# Patient Record
Sex: Male | Born: 1985 | Race: Asian | Hispanic: No | Marital: Married | Smoking: Never smoker
Health system: Southern US, Community
[De-identification: ages and names within clinical notes are randomized; demographics above are authoritative.]

## PROBLEM LIST (undated history)

## (undated) DIAGNOSIS — E039 Hypothyroidism, unspecified: Secondary | ICD-10-CM

## (undated) DIAGNOSIS — E785 Hyperlipidemia, unspecified: Secondary | ICD-10-CM

## (undated) DIAGNOSIS — I493 Ventricular premature depolarization: Secondary | ICD-10-CM

## (undated) HISTORY — DX: Hyperlipidemia, unspecified: E78.5

---

## 2017-02-19 ENCOUNTER — Encounter (HOSPITAL_COMMUNITY): Payer: Self-pay | Admitting: Emergency Medicine

## 2017-02-19 ENCOUNTER — Emergency Department (HOSPITAL_COMMUNITY): Payer: 59

## 2017-02-19 ENCOUNTER — Emergency Department (HOSPITAL_COMMUNITY)
Admission: EM | Admit: 2017-02-19 | Discharge: 2017-02-19 | Disposition: A | Discharge status: Home / Self Care | Payer: 59 | Attending: Emergency Medicine | Admitting: Emergency Medicine

## 2017-02-19 DIAGNOSIS — R1030 Lower abdominal pain, unspecified: Secondary | ICD-10-CM | POA: Diagnosis present

## 2017-02-19 DIAGNOSIS — E039 Hypothyroidism, unspecified: Secondary | ICD-10-CM | POA: Diagnosis not present

## 2017-02-19 DIAGNOSIS — K529 Noninfective gastroenteritis and colitis, unspecified: Secondary | ICD-10-CM | POA: Diagnosis not present

## 2017-02-19 HISTORY — DX: Hypothyroidism, unspecified: E03.9

## 2017-02-19 LAB — URINALYSIS, ROUTINE W REFLEX MICROSCOPIC
BILIRUBIN URINE: NEGATIVE
Bacteria, UA: NONE SEEN
GLUCOSE, UA: NEGATIVE mg/dL
KETONES UR: 20 mg/dL — AB
LEUKOCYTES UA: NEGATIVE
NITRITE: NEGATIVE
PH: 6 (ref 5.0–8.0)
Protein, ur: 30 mg/dL — AB
SPECIFIC GRAVITY, URINE: 1.015 (ref 1.005–1.030)
Squamous Epithelial / LPF: NONE SEEN

## 2017-02-19 LAB — CBC
HCT: 47.5 % (ref 39.0–52.0)
Hemoglobin: 16.3 g/dL (ref 13.0–17.0)
MCH: 29.5 pg (ref 26.0–34.0)
MCHC: 34.3 g/dL (ref 30.0–36.0)
MCV: 86.1 fL (ref 78.0–100.0)
PLATELETS: 232 10*3/uL (ref 150–400)
RBC: 5.52 MIL/uL (ref 4.22–5.81)
RDW: 12.9 % (ref 11.5–15.5)
WBC: 8.4 10*3/uL (ref 4.0–10.5)

## 2017-02-19 LAB — LIPASE, BLOOD: LIPASE: 30 U/L (ref 11–51)

## 2017-02-19 LAB — COMPREHENSIVE METABOLIC PANEL
ALT: 14 U/L — AB (ref 17–63)
AST: 23 U/L (ref 15–41)
Albumin: 4 g/dL (ref 3.5–5.0)
Alkaline Phosphatase: 50 U/L (ref 38–126)
Anion gap: 11 (ref 5–15)
BILIRUBIN TOTAL: 0.8 mg/dL (ref 0.3–1.2)
BUN: 9 mg/dL (ref 6–20)
CALCIUM: 8.9 mg/dL (ref 8.9–10.3)
CHLORIDE: 100 mmol/L — AB (ref 101–111)
CO2: 25 mmol/L (ref 22–32)
CREATININE: 1.21 mg/dL (ref 0.61–1.24)
Glucose, Bld: 96 mg/dL (ref 65–99)
Potassium: 4 mmol/L (ref 3.5–5.1)
Sodium: 136 mmol/L (ref 135–145)
TOTAL PROTEIN: 7.5 g/dL (ref 6.5–8.1)

## 2017-02-19 MED ORDER — IOPAMIDOL (ISOVUE-300) INJECTION 61%
INTRAVENOUS | Status: AC
  Filled 2017-02-19: qty 100

## 2017-02-19 MED ORDER — SODIUM CHLORIDE 0.9 % IV BOLUS (SEPSIS)
1000.0000 mL | Freq: Once | INTRAVENOUS | Status: AC
  Administered 2017-02-19: 1000 mL via INTRAVENOUS

## 2017-02-19 MED ORDER — METRONIDAZOLE 500 MG PO TABS: 500.0000 mg | ORAL_TABLET | Freq: Three times a day (TID) | ORAL | 0 refills | Status: DC

## 2017-02-19 MED ORDER — CIPROFLOXACIN HCL 500 MG PO TABS: 500.0000 mg | ORAL_TABLET | Freq: Two times a day (BID) | ORAL | 0 refills | Status: DC

## 2017-02-19 MED ORDER — IOPAMIDOL (ISOVUE-300) INJECTION 61%
100.0000 mL | Freq: Once | INTRAVENOUS | Status: AC | PRN
  Administered 2017-02-19: 100 mL via INTRAVENOUS

## 2017-02-19 MED ORDER — ALBUTEROL SULFATE HFA 108 (90 BASE) MCG/ACT IN AERS
2.0000 | INHALATION_SPRAY | RESPIRATORY_TRACT | Status: DC | PRN
  Administered 2017-02-19: 2 via RESPIRATORY_TRACT
  Filled 2017-02-19: qty 6.7

## 2017-02-19 NOTE — ED Notes (Signed)
ED Provider at bedside. 

## 2017-02-19 NOTE — ED Provider Notes (Signed)
[facility]-EMERGENCY DEPT Provider Note   CSN: [id number] Arrival date & time: 02/19/17  4098     History   Chief Complaint Chief Complaint  Patient presents with  . Abdominal Pain    HPI George Lopez is a 31 y.o. male.  Patient is a 31 year old male with no significant past medical history who presents with abdominal pain and diarrhea. He states it started about 30 hours ago with watery diarrhea and pain to his lower abdomen. The pain has continued. He still having some watery, nonbloody diarrhea. He denies any nausea or vomiting. He denies any urinary symptoms. He has had some fevers reported at home. He lives in [place]. He recently had an endoscopy for some reflux symptoms and was diagnosed with H. pylori. He finished antibiotics for this 3 days ago. He denies any other gastrointestinal issues.  He recently traveled back from [place] to the [place] yesterday. He has not taken anything for the symptoms.      Past Medical History:  Diagnosis Date  . Hypothyroid     There are no active problems to display for this patient.   History reviewed. No pertinent surgical history.     Home Medications    Prior to Admission medications   Medication Sig Start Date End Date Taking? Authorizing Provider  ciprofloxacin (CIPRO) 500 MG tablet Take 1 tablet (500 mg total) by mouth 2 (two) times daily. One po bid x 10 days 02/19/17   Rolan Bucco, MD  metroNIDAZOLE (FLAGYL) 500 MG tablet Take 1 tablet (500 mg total) by mouth 3 (three) times daily. One po bid x 10 days 02/19/17   Rolan Bucco, MD    Family History History reviewed. No pertinent family history.  Social History Social History  Substance Use Topics  . Smoking status: Never Smoker  . Smokeless tobacco: Never Used  . Alcohol use No     Allergies   Patient has no known allergies.   Review of Systems Review of Systems  Constitutional: Positive for fever. Negative for chills, diaphoresis and fatigue.  HENT:  Negative for congestion, rhinorrhea and sneezing.   Eyes: Negative.   Respiratory: Negative for cough, chest tightness and shortness of breath.   Cardiovascular: Negative for chest pain and leg swelling.  Gastrointestinal: Positive for abdominal pain and diarrhea. Negative for blood in stool, nausea and vomiting.  Genitourinary: Negative for difficulty urinating, flank pain, frequency and hematuria.  Musculoskeletal: Negative for arthralgias and back pain.  Skin: Negative for rash.  Neurological: Negative for dizziness, speech difficulty, weakness, numbness and headaches.     Physical Exam Updated Vital Signs BP 105/73 (BP Location: Right Arm)   Pulse 91   Temp 99.9 F (37.7 C) (Oral)   Resp 18   Ht  (1.575 m)   Wt 61 kg (134 lb 7.7 oz)   SpO2 100%   BMI 24.60 kg/m   Physical Exam  Constitutional: He is oriented to person, place, and time. He appears well-developed and well-nourished.  HENT:  Head: Normocephalic and atraumatic.  Eyes: Pupils are equal, round, and reactive to light.  Neck: Normal range of motion. Neck supple.  Cardiovascular: Normal rate, regular rhythm and normal heart sounds.   Pulmonary/Chest: Effort normal and breath sounds normal. No respiratory distress. He has no wheezes. He has no rales. He exhibits no tenderness.  Abdominal: Soft. Bowel sounds are normal. There is tenderness (Positive tenderness to the P umbilical area and right lower quadrant). There is no rebound and no guarding.  Musculoskeletal: Normal range of motion. He exhibits no edema.  Lymphadenopathy:    He has no cervical adenopathy.  Neurological: He is alert and oriented to person, place, and time.  Skin: Skin is warm and dry. No rash noted.  Psychiatric: He has a normal mood and affect.     ED Treatments / Results  Labs (all labs ordered are listed, but only abnormal results are displayed) Labs Reviewed  COMPREHENSIVE METABOLIC PANEL - Abnormal; Notable for the following:        Result Value   Chloride 100 (*)    ALT 14 (*)    All other components within normal limits  URINALYSIS, ROUTINE W REFLEX MICROSCOPIC - Abnormal; Notable for the following:    Hgb urine dipstick MODERATE (*)    Ketones, ur 20 (*)    Protein, ur 30 (*)    All other components within normal limits  LIPASE, BLOOD  CBC    EKG  EKG Interpretation None       Radiology Ct Abdomen Pelvis W Contrast  Result Date: 02/19/2017 CLINICAL DATA:  Pt reports lower abd pain and diarrhea for the past day and a half. Just flew in from [place]. No emesis. Has had a fever.Had endoscopy a month ago and was diagnosed with H pylori. Finished abx for this 3 days ago. EXAM: CT ABDOMEN AND PELVIS WITH CONTRAST TECHNIQUE: Multidetector CT imaging of the abdomen and pelvis was performed using the standard protocol following bolus administration of intravenous contrast. CONTRAST:  ISOVUE-300 IOPAMIDOL (ISOVUE-300) INJECTION 61% COMPARISON:  None. FINDINGS: Lower chest: Clear lung bases heart.  Normal size. Hepatobiliary: No focal liver abnormality is seen. No gallstones, gallbladder wall thickening, or biliary dilatation. Pancreas: Unremarkable. No pancreatic ductal dilatation or surrounding inflammatory changes. Spleen: Normal in size without focal abnormality. Adrenals/Urinary Tract: Adrenal glands are unremarkable. Kidneys are normal, without renal calculi, focal lesion, or hydronephrosis. Bladder is unremarkable. Stomach/Bowel: There is wall thickening of the right colon extending to the mid transverse colon. Subtle adjacent mesenteric inflammation. Remainder of the colon is unremarkable. Stomach and small bowel are unremarkable. Appendix contains air, but is mildly dilated measuring 7 mm. This is likely a normal variant for this patient. There is no peri appendiceal inflammation. Vascular/Lymphatic: No vascular abnormality. There are prominent ileo colic mesenteric lymph nodes, largest measuring 7 mm in short  axis. Reproductive: Unremarkable. Other: No abdominal wall hernia or abnormality. No abdominopelvic ascites. Musculoskeletal: No acute or significant osseous findings. IMPRESSION: 1. Colitis involving the right colon. This may be infectious or inflammatory in etiology. 2. Appendix is mildly dilated, but there is no associated inflammation. Air is seen extending into this appendiceal tip. This is likely a normal variant for this patient. 3. No other evidence of an acute abnormality. Electronically Signed   By: Amie Portland M.D.   On: 02/19/2017 09:59    Procedures Procedures (including critical care time)  Medications Ordered in ED Medications  iopamidol (ISOVUE-300) 61 % injection (not administered)  albuterol (PROVENTIL HFA;VENTOLIN HFA) 108 (90 Base) MCG/ACT inhaler 2 puff (not administered)  sodium chloride 0.9 % bolus 1,000 mL (0 mLs Intravenous Stopped 02/19/17 1014)  iopamidol (ISOVUE-300) 61 % injection 100 mL (100 mLs Intravenous Contrast Given 02/19/17 0937)     Initial Impression / Assessment and Plan / ED Course  I have reviewed the triage vital signs and the nursing notes.  Pertinent labs & imaging results that were available during my care of the patient were reviewed by me and  considered in my medical decision making (see chart for details).     Patient presents with watery diarrhea and lower abdominal pain. He did have some tenderness to the right lower quadrant so a CT scan was ordered to assess if the appendix. There is some slight enlargement any appendix without suggestions of appendicitis. There is evidence of colitis. I will go ahead and treat him with Cipro and Flagyl. I advised him to use a clear liquid diet for the next 24 hours and slowly progress after this. I did has some slight enlargement of the appendix and he needs to be reassessed if he has any worsening pain in his abdomen, in particular to the right lower quadrant. He does acknowledge this.  I also advised him  that he did have some blood in his urine. Advised at some point he needs to have his urine rechecked to make sure that the blood clears. On discharge he had some slight wheezing on lung exam. He denies any shortness of breath. He states that he typically gets wheezing when he is exposed to dust and he has used an inhaler before but he does not currently have an inhaler. He denies any shortness of breath. No pleuritic chest pain or other suggestions of a pulmonary embolus. He was dispensed an albuterol inhaler. Return precautions were given.  Final Clinical Impressions(s) / ED Diagnoses   Final diagnoses:  Colitis    New Prescriptions New Prescriptions   CIPROFLOXACIN (CIPRO) 500 MG TABLET    Take 1 tablet (500 mg total) by mouth 2 (two) times daily. One po bid x 10 days   METRONIDAZOLE (FLAGYL) 500 MG TABLET    Take 1 tablet (500 mg total) by mouth 3 (three) times daily. One po bid x 10 days     Rolan Bucco, MD 02/19/17 1040

## 2017-02-19 NOTE — ED Notes (Signed)
Pt began complaining of shortness of breath starting 10 minutes ago. Inspiratory and expiratory wheezes heard while listening to lung sounds. O2 sat and vitals are stable. Will continue to monitor.

## 2017-02-19 NOTE — ED Triage Notes (Addendum)
Pt reports lower abd pain and diarrhea for the past day and a half. Just flew in from [place]. No emesis. Has had a fever. Had endoscopy a month ago and was diagnosed with H pylori. Finished abx for this 3 days ago.

## 2017-06-26 ENCOUNTER — Encounter (HOSPITAL_COMMUNITY): Payer: Self-pay | Admitting: *Deleted

## 2017-06-26 ENCOUNTER — Other Ambulatory Visit: Payer: Self-pay

## 2017-06-26 DIAGNOSIS — E039 Hypothyroidism, unspecified: Secondary | ICD-10-CM | POA: Diagnosis not present

## 2017-06-26 DIAGNOSIS — R002 Palpitations: Secondary | ICD-10-CM | POA: Diagnosis not present

## 2017-06-26 DIAGNOSIS — I493 Ventricular premature depolarization: Secondary | ICD-10-CM | POA: Diagnosis not present

## 2017-06-26 LAB — CBC
HCT: 48 % (ref 39.0–52.0)
HEMOGLOBIN: 16.3 g/dL (ref 13.0–17.0)
MCH: 29.3 pg (ref 26.0–34.0)
MCHC: 34 g/dL (ref 30.0–36.0)
MCV: 86.2 fL (ref 78.0–100.0)
Platelets: 293 10*3/uL (ref 150–400)
RBC: 5.57 MIL/uL (ref 4.22–5.81)
RDW: 13 % (ref 11.5–15.5)
WBC: 8.5 10*3/uL (ref 4.0–10.5)

## 2017-06-26 LAB — BASIC METABOLIC PANEL
ANION GAP: 11 (ref 5–15)
BUN: 9 mg/dL (ref 6–20)
CALCIUM: 8.9 mg/dL (ref 8.9–10.3)
CO2: 24 mmol/L (ref 22–32)
Chloride: 105 mmol/L (ref 101–111)
Creatinine, Ser: 0.92 mg/dL (ref 0.61–1.24)
GFR calc non Af Amer: >60 mL/min (ref >60)
Glucose, Bld: 88 mg/dL (ref 65–99)
Potassium: 3.6 mmol/L (ref 3.5–5.1)
Sodium: 140 mmol/L (ref 135–145)

## 2017-06-26 LAB — I-STAT TROPONIN, ED: TROPONIN I, POC: 0 ng/mL (ref 0.00–0.08)

## 2017-06-26 NOTE — ED Triage Notes (Signed)
Pt has a hx of palpitations, felt his heart racing today and was seen at [facility], then referred to ED. Reports frequent belching and increased stress today. Initial rhythm at [facility] showed frequent PVCs. Pt recently moved to [place] from [place] three months ago. Reports he had been consistently following up with a cardiologist in [place] for the past 10 years, has had negative stress tests and has worn a halter in the past without a definitive diagnosis. Pt also has hx of hypothyroidism, was taken Levothyroxine 100mcg, which was increased 125mcg, but has not had TSH rechecked. Last check was in September.

## 2017-06-27 ENCOUNTER — Other Ambulatory Visit: Payer: Self-pay

## 2017-06-27 ENCOUNTER — Emergency Department (HOSPITAL_COMMUNITY)
Admission: EM | Admit: 2017-06-27 | Discharge: 2017-06-27 | Disposition: A | Discharge status: Home / Self Care | Payer: 59 | Attending: Emergency Medicine | Admitting: Emergency Medicine

## 2017-06-27 ENCOUNTER — Emergency Department (HOSPITAL_COMMUNITY): Payer: 59

## 2017-06-27 DIAGNOSIS — I493 Ventricular premature depolarization: Secondary | ICD-10-CM

## 2017-06-27 DIAGNOSIS — R002 Palpitations: Secondary | ICD-10-CM

## 2017-06-27 HISTORY — DX: Ventricular premature depolarization: I49.3

## 2017-06-27 LAB — TSH: TSH: 1.709 u[IU]/mL (ref 0.350–4.500)

## 2017-06-27 LAB — I-STAT TROPONIN, ED: Troponin i, poc: 0 ng/mL (ref 0.00–0.08)

## 2017-06-27 LAB — MAGNESIUM: MAGNESIUM: 2.2 mg/dL (ref 1.7–2.4)

## 2017-06-27 NOTE — ED Notes (Signed)
Recollect blood for istat troponin.

## 2017-06-27 NOTE — Discharge Instructions (Signed)
There is no evidence of heart attack.  You're likely feeling PVCs.  Follow-up with your doctor.  Return to the ED if you develop chest pain, shortness of breath or any other concerns.

## 2017-06-27 NOTE — ED Provider Notes (Signed)
[name] EM[facility]ENCY [facility] Provider Note   CSN: [id number] Arrival date & time: 06/26/17  2218     History   Chief Complaint Chief Complaint  Patient presents with  . Palpitations    HPI George Lopez is a 32 y.o. male.  Patient presents from walk-in clinic with sensation of palpitations.  States this started this afternoon after undergoing a stressful meeting this morning.  He denies any chest pain or shortness of breath.  No dizziness or lightheadedness.  [place] from [place] about 3 months ago.  He states he has a history of arrhythmia and followed up with a cardiologist every year in [place].  He has had normal echocardiograms, stress test and Holter monitors.  He was not prescribed any medications for his arrhythmia.  He also has a history of hypothyroidism and is on levothyroxine at a stable dose.  Denies any recent constipation, fatigue, tiredness.  He does admit to drinking more caffeine since [place] the [place].  Good p.o. intake and urine output.  His palpitations have improved since he came to the ED.  He denies any illicit drug or alcohol use.   The history is provided by the patient.  Palpitations   Pertinent negatives include no fever, no abdominal pain, no nausea, no vomiting, no dizziness, no weakness and no shortness of breath.    Past Medical History:  Diagnosis Date  . Hypothyroid   . PVC (premature ventricular contraction)     There are no active problems to display for this patient.   History reviewed. No pertinent surgical history.     Home Medications    Prior to Admission medications   Medication Sig Start Date End Date Taking? Authorizing Provider  ciprofloxacin (CIPRO) 500 MG tablet Take 1 tablet (500 mg total) by mouth 2 (two) times daily. One po bid x 10 days 02/19/17   Rolan BuccoBelfi, Melanie, MD  metroNIDAZOLE (FLAGYL) 500 MG tablet Take 1 tablet (500 mg total) by mouth 3 (three) times daily. One po bid x 10 days  02/19/17   Rolan BuccoBelfi, Melanie, MD    Family History No family history on file.  Social History Social History   Tobacco Use  . Smoking status: Never Smoker  . Smokeless tobacco: Never Used  Substance Use Topics  . Alcohol use: No  . Drug use: Not on file     Allergies   Patient has no known allergies.   Review of Systems Review of Systems  Constitutional: Negative for activity change, appetite change and fever.  HENT: Negative for congestion.   Eyes: Negative for visual disturbance.  Respiratory: Negative for chest tightness and shortness of breath.   Cardiovascular: Positive for palpitations.  Gastrointestinal: Negative for abdominal pain, nausea and vomiting.  Genitourinary: Negative for dysuria and hematuria.  Musculoskeletal: Negative for arthralgias and myalgias.  Skin: Negative for rash.  Neurological: Negative for dizziness and weakness.    all other systems are negative except as noted in the HPI and PMH.    Physical Exam Updated Vital Signs BP (!) 132/93   Pulse 89   Temp 98.8 F (37.1 C) (Oral)   Resp 16   SpO2 100%   Physical Exam  Constitutional: He is oriented to person, place, and time. He appears well-developed and well-nourished. No distress.  HENT:  Head: Normocephalic and atraumatic.  Mouth/Throat: Oropharynx is clear and moist. No oropharyngeal exudate.  Eyes: Conjunctivae and EOM are normal. Pupils are equal, round, and reactive to  light.  Neck: Normal range of motion. Neck supple.  No meningismus.  Cardiovascular: Normal rate, regular rhythm, normal heart sounds and intact distal pulses.  No murmur heard. Frequent PVCs on monitor Irregular rhythm  Pulmonary/Chest: Effort normal and breath sounds normal. No respiratory distress.  Abdominal: Soft. There is no tenderness. There is no rebound and no guarding.  Musculoskeletal: Normal range of motion. He exhibits no edema or tenderness.  Neurological: He is alert and oriented to person, place,  and time. No cranial nerve deficit. He exhibits normal muscle tone. Coordination normal.  No ataxia on finger to nose bilaterally. No pronator drift. 5/5 strength throughout. CN 2-12 intact.Equal grip strength. Sensation intact.   Skin: Skin is warm.  Psychiatric: He has a normal mood and affect. His behavior is normal.  Nursing note and vitals reviewed.    ED Treatments / Results  Labs (all labs ordered are listed, but only abnormal results are displayed) Labs Reviewed  BASIC METABOLIC PANEL  CBC  TSH  MAGNESIUM  I-STAT TROPONIN, ED  I-STAT TROPONIN, ED    EKG  EKG Interpretation  Date/Time:  Monday June 26 2017 22:24:36 EST Ventricular Rate:  84 PR Interval:  148 QRS Duration: 106 QT Interval:  400 QTC Calculation: 472 R Axis:   77 Text Interpretation:  Sinus rhythm with frequent Premature ventricular complexes Otherwise normal ECG No old tracing to compare Confirmed by Dione Booze ([id number]) on 06/26/2017 11:55:01 PM       Radiology Dg Chest 2 View  Result Date: 06/27/2017 CLINICAL DATA:  Acute onset of palpitations.  Tachycardia. EXAM: CHEST  2 VIEW COMPARISON:  None. FINDINGS: The lungs are well-aerated and clear. There is no evidence of focal opacification, pleural effusion or pneumothorax. The heart is normal in size; the mediastinal contour is within normal limits. No acute osseous abnormalities are seen. IMPRESSION: No acute cardiopulmonary process seen. Electronically Signed   By: Roanna Raider M.D.   On: 06/27/2017 03:02    Procedures Procedures (including critical care time)  Medications Ordered in ED Medications - No data to display   Initial Impression / Assessment and Plan / ED Course  I have reviewed the triage vital signs and the nursing notes.  Pertinent labs & imaging results that were available during my care of the patient were reviewed by me and considered in my medical decision making (see chart for details).    Patient with history of  "arrhythmia" sent from walk-in clinic with palpitations.  No chest pain or shortness of breath.  EKG shows frequent PVCs.  Labs are reassuring.  Troponin is negative.  Electrolytes are normal.  Patient with no chest pain or shortness of breath.  Troponin negative x2.  Suspect his palpitations are due to his PVCs.  He admits to recent poor sleep and increased caffeine use.  Patient states he was followed in [place] by cardiologist for the past 10 years but had reassuring testing including Holter monitor and echocardiogram.  He was never placed on any cardiac medications.  He is requesting referral to cardiology and endocrinology for follow-up.  Referrals given.  PCP follow-up.  Return precautions discussed.    Final Clinical Impressions(s) / ED Diagnoses   Final diagnoses:  Palpitations  PVC (premature ventricular contraction)    ED Discharge Orders    None       Deneice Wack, Jeannett Senior, MD 06/27/17 0403

## 2017-07-04 ENCOUNTER — Encounter: Payer: Self-pay | Admitting: Cardiology

## 2017-07-04 ENCOUNTER — Ambulatory Visit (INDEPENDENT_AMBULATORY_CARE_PROVIDER_SITE_OTHER): Payer: PRIVATE HEALTH INSURANCE | Admitting: Cardiology

## 2017-07-04 VITALS — BP 128/80 | HR 97 | Ht 62.0 in | Wt 138.0 lb

## 2017-07-04 DIAGNOSIS — I493 Ventricular premature depolarization: Secondary | ICD-10-CM | POA: Insufficient documentation

## 2017-07-04 DIAGNOSIS — Z13220 Encounter for screening for lipoid disorders: Secondary | ICD-10-CM

## 2017-07-04 DIAGNOSIS — R9431 Abnormal electrocardiogram [ECG] [EKG]: Secondary | ICD-10-CM | POA: Insufficient documentation

## 2017-07-04 DIAGNOSIS — R079 Chest pain, unspecified: Secondary | ICD-10-CM

## 2017-07-04 NOTE — Progress Notes (Signed)
Cardiology Office Note:    Date:  07/04/2017   ID:  George Lopez, DOB Jun 10, 1985, MRN [id number]  PCP:  Patient, No Pcp Per  Cardiologist:  Garwin Brothers, MD   Referring MD: No ref. provider found    ASSESSMENT:    1. Chest pain, unspecified type   2. Abnormal electrocardiogram (ECG) (EKG)   3. Premature ventricular contractions (PVCs) (VPCs)   4. Screening for cholesterol level    PLAN:    In order of problems listed above:  1. Primary prevention stressed with the patient.  Importance of compliance with diet and medications stressed and he vocalized understanding. 2. Is stable.  Importance of regular exercise stressed.  I told him that in view of his chest discomfort I would like to do a stress echo.  If this is normal then he should start an exercise program.  We will check his lipids today. 3. His thyroid and other issues are managed by his primary care physician.  He will be seen in follow-up appointment in a month or earlier if he has any concerns.   Medication Adjustments/Labs and Tests Ordered: Current medicines are reviewed at length with the patient today.  Concerns regarding medicines are outlined above.  Orders Placed This Encounter  Procedures  . Lipid panel  . Hepatic function panel  . ECHOCARDIOGRAM STRESS TEST   No orders of the defined types were placed in this encounter.    History of Present Illness:    George Lopez is a 32 y.o. male who is being seen today for the evaluation of PVCs and skipped beat sensation and palpitations.  Patient is a pleasant 32 year old male.  He has past medical history abnormal EKGs in the past.  He is evaluated for this by his physicians in [place].  He has been around here for the past couple of months and he tells me that he has been under significant stress at work and has been drinking a lot of coffee.  He feels a little chest discomfort at times.  This is not related to exertion.  It is sharp in nature with no  radiation.  No syncope.  At the time of my evaluation, the patient is alert awake oriented and in no distress.  Past Medical History:  Diagnosis Date  . Hypothyroid   . PVC (premature ventricular contraction)     History reviewed. No pertinent surgical history.  Current Medications: Current Meds  Medication Sig  . levothyroxine (SYNTHROID, LEVOTHROID) 125 MCG tablet Take 125 mcg by mouth daily.  [name] omeprazole (PRILOSEC) 20 MG capsule Take 20 mg by mouth daily as needed (for acid reflux).      Allergies:   Patient has no known allergies.   Social History   Socioeconomic History  . Marital status: Married    Spouse name: None  . Number of children: None  . Years of education: None  . Highest education level: None  Social Needs  . Financial resource strain: None  . Food insecurity - worry: None  . Food insecurity - inability: None  . Transportation needs - medical: None  . Transportation needs - non-medical: None  Occupational History  . None  Tobacco Use  . Smoking status: Never Smoker  . Smokeless tobacco: Never Used  Substance and Sexual Activity  . Alcohol use: No  . Drug use: None  . Sexual activity: None  Other Topics Concern  . None  Social History Narrative  . None     Family  History: The patient's family history is not on file.  ROS:   Please see the history of present illness.    All other systems reviewed and are negative.  EKGs/Labs/Other Studies Reviewed:    The following studies were reviewed today: I discussed my findings with the patient at length.  EKG done and sent to me revealed sinus rhythm and PVCs and the patient's event that she has had similar EKGs in the past.   Recent Labs: 02/19/2017: ALT 14 06/26/2017: BUN 9; Creatinine, Ser 0.92; Hemoglobin 16.3; Platelets 293; Potassium 3.6; Sodium 140; TSH 1.709 06/27/2017: Magnesium 2.2  Recent Lipid Panel No results found for: CHOL, TRIG, HDL, CHOLHDL, VLDL, LDLCALC, LDLDIRECT  Physical  Exam:    VS:  BP 128/80 (BP Location: Left Arm, Patient Position: Sitting, Cuff Size: Normal)   Pulse 97   Ht 5\' 2"  (1.575 m)   Wt 138 lb (62.6 kg)   SpO2 98%   BMI 25.24 kg/m     Wt Readings from Last 3 Encounters:  07/04/17 138 lb (62.6 kg)  02/19/17 134 lb 7.7 oz (61 kg)     GEN: Patient is in no acute distress HEENT: Normal NECK: No JVD; No carotid bruits LYMPHATICS: No lymphadenopathy CARDIAC: S1 S2 regular, 2/6 systolic murmur at the apex. RESPIRATORY:  Clear to auscultation without rales, wheezing or rhonchi  ABDOMEN: Soft, non-tender, non-distended MUSCULOSKELETAL:  No edema; No deformity  SKIN: Warm and dry NEUROLOGIC:  Alert and oriented x 3 PSYCHIATRIC:  Normal affect    Signed, Garwin Brothersajan R Houston Surges, MD  07/04/2017 9:35 AM    [facility]

## 2017-07-04 NOTE — Patient Instructions (Signed)
Medication Instructions:  Your physician recommends that you continue on your current medications as directed. Please refer to the Current Medication list given to you today.  Labwork: Your physician recommends that you have the following labs drawn: Liver and lipid panel  Testing/Procedures: Your physician has requested that you have a stress echocardiogram. For further information please visit [email or web address]. Please follow instruction sheet as given.  Follow-Up: Your physician recommends that you schedule a follow-up appointment in: 1 month  Any Other Special Instructions Will Be Listed Below (If Applicable).     If you need a refill on your cardiac medications before your next appointment, please call your pharmacy.   [facility]  Garey HamAshley A, RN, BSN

## 2017-07-05 ENCOUNTER — Telehealth: Payer: Self-pay | Admitting: Cardiology

## 2017-07-05 LAB — LIPID PANEL
CHOL/HDL RATIO: 4.5 ratio (ref 0.0–5.0)
Cholesterol, Total: 204 mg/dL — ABNORMAL HIGH (ref 100–199)
HDL: 45 mg/dL (ref >39)
LDL CALC: 123 mg/dL — AB (ref 0–99)
TRIGLYCERIDES: 178 mg/dL — AB (ref 0–149)
VLDL CHOLESTEROL CAL: 36 mg/dL (ref 5–40)

## 2017-07-05 LAB — HEPATIC FUNCTION PANEL
ALT: 16 IU/L (ref 0–44)
AST: 18 IU/L (ref 0–40)
Albumin: 4.6 g/dL (ref 3.5–5.5)
Alkaline Phosphatase: 60 IU/L (ref 39–117)
BILIRUBIN, DIRECT: 0.08 mg/dL (ref 0.00–0.40)
Bilirubin Total: 0.3 mg/dL (ref 0.0–1.2)
Total Protein: 7.5 g/dL (ref 6.0–8.5)

## 2017-07-05 NOTE — Telephone Encounter (Signed)
Spoke with pt regarding his lab work

## 2017-07-05 NOTE — Telephone Encounter (Signed)
Someone called him, not sure why.

## 2017-07-28 ENCOUNTER — Other Ambulatory Visit (HOSPITAL_BASED_OUTPATIENT_CLINIC_OR_DEPARTMENT_OTHER): Payer: PRIVATE HEALTH INSURANCE

## 2017-07-31 ENCOUNTER — Ambulatory Visit (HOSPITAL_BASED_OUTPATIENT_CLINIC_OR_DEPARTMENT_OTHER)
Admission: RE | Admit: 2017-07-31 | Discharge: 2017-07-31 | Disposition: A | Discharge status: Home / Self Care | Payer: 59 | Source: Ambulatory Visit | Attending: Cardiology | Admitting: Cardiology

## 2017-07-31 DIAGNOSIS — R9431 Abnormal electrocardiogram [ECG] [EKG]: Secondary | ICD-10-CM | POA: Insufficient documentation

## 2017-07-31 DIAGNOSIS — I493 Ventricular premature depolarization: Secondary | ICD-10-CM | POA: Diagnosis not present

## 2017-07-31 DIAGNOSIS — R079 Chest pain, unspecified: Secondary | ICD-10-CM | POA: Diagnosis not present

## 2017-07-31 NOTE — Progress Notes (Signed)
Echocardiogram Echocardiogram Stress Test has been performed.  Dorothey BasemanReel, Bresha Hosack M 07/31/2017, 3:00 PM

## 2017-08-01 ENCOUNTER — Ambulatory Visit (INDEPENDENT_AMBULATORY_CARE_PROVIDER_SITE_OTHER): Payer: PRIVATE HEALTH INSURANCE | Admitting: Cardiology

## 2017-08-01 ENCOUNTER — Encounter: Payer: Self-pay | Admitting: Cardiology

## 2017-08-01 VITALS — BP 110/68 | HR 77 | Ht 61.0 in | Wt 135.1 lb

## 2017-08-01 DIAGNOSIS — R9431 Abnormal electrocardiogram [ECG] [EKG]: Secondary | ICD-10-CM | POA: Diagnosis not present

## 2017-08-01 DIAGNOSIS — I493 Ventricular premature depolarization: Secondary | ICD-10-CM

## 2017-08-01 NOTE — Patient Instructions (Signed)
Medication Instructions:  Your physician recommends that you continue on your current medications as directed. Please refer to the Current Medication list given to you today.  Labwork: None  Testing/Procedures: None  Follow-Up: Your physician recommends that you schedule a follow-up appointment in: as neeeded  Any Other Special Instructions Will Be Listed Below (If Applicable).     If you need a refill on your cardiac medications before your next appointment, please call your pharmacy.   [facility]  Garey HamAshley A, RN, BSN

## 2017-08-01 NOTE — Progress Notes (Signed)
Cardiology Office Note:    Date:  08/01/2017   ID:  George Lopez, DOB [birth year], MRN [id number]  PCP:  Patient, No Pcp Per  Cardiologist:  Garwin Brothersajan R Jenan Ellegood, MD   Referring MD: No ref. provider found    ASSESSMENT:    1. Premature ventricular contractions (PVCs) (VPCs)   2. Abnormal electrocardiogram (ECG) (EKG)    PLAN:    In order of problems listed above:  1. Primary prevention stressed with the patient.  Importance of compliance with diet and medications stressed and he vocalized understanding.  His blood pressure stable.  His exercise tolerance is excellent and his PVCs defect disappeared with exercise which is an excellent prognostic sign.  He is very happy to hear about this. 2. Lipids were reviewed extensively and diet was discussed.  He is going to be established with a primary care physician in the next few days. 3. He will be seen in follow-up appointment on a as needed basis only.   Medication Adjustments/Labs and Tests Ordered: Current medicines are reviewed at length with the patient today.  Concerns regarding medicines are outlined above.  No orders of the defined types were placed in this encounter.  No orders of the defined types were placed in this encounter.    Chief Complaint  Patient presents with  . Follow-up    Post cardiac testing     History of Present Illness:    George Lopez is a 32 y.o. male.  The patient was evaluated for chest pain like symptoms and PVCs.  His evaluation was unremarkable.  He did very well on the stress echo and his family is now here for area and he tells me that he is much more happier and is eating healthier.  No chest pain orthopnea or PND.  He has begun an exercise program.  At the time of my evaluation, the patient is alert awake oriented and in no distress.  Past Medical History:  Diagnosis Date  . Hypothyroid   . PVC (premature ventricular contraction)     History reviewed. No pertinent surgical  history.  Current Medications: No outpatient medications have been marked as taking for the 08/01/17 encounter (Office Visit) with Dennys Traughber, Aundra Dubinajan R, MD.     Allergies:   Patient has no known allergies.   Social History   Socioeconomic History  . Marital status: Married    Spouse name: None  . Number of children: None  . Years of education: None  . Highest education level: None  Social Needs  . Financial resource strain: None  . Food insecurity - worry: None  . Food insecurity - inability: None  . Transportation needs - medical: None  . Transportation needs - non-medical: None  Occupational History  . None  Tobacco Use  . Smoking status: Never Smoker  . Smokeless tobacco: Never Used  Substance and Sexual Activity  . Alcohol use: No  . Drug use: None  . Sexual activity: None  Other Topics Concern  . None  Social History Narrative  . None     Family History: The patient's family history is not on file.  ROS:   Please see the history of present illness.    All other systems reviewed and are negative.  EKGs/Labs/Other Studies Reviewed:    The following studies were reviewed today: I discussed the findings of the stress echo, lipids with the patient at extensive length.   Recent Labs: 06/26/2017: BUN 9; Creatinine, Ser 0.92; Hemoglobin 16.3; Platelets 293; Potassium  3.6; Sodium 140; TSH 1.709 06/27/2017: Magnesium 2.2 07/04/2017: ALT 16  Recent Lipid Panel    Component Value Date/Time   CHOL 204 (H) 07/04/2017 0937   TRIG 178 (H) 07/04/2017 0937   HDL 45 07/04/2017 0937   CHOLHDL 4.5 07/04/2017 0937   LDLCALC 123 (H) 07/04/2017 0937    Physical Exam:    VS:  BP 110/68 (BP Location: Left Arm, Patient Position: Sitting)   Pulse 77   Ht 5\' 1"  (1.549 m)   Wt 135 lb 1.3 oz (61.3 kg)   SpO2 98%   BMI 25.52 kg/m     Wt Readings from Last 3 Encounters:  08/01/17 135 lb 1.3 oz (61.3 kg)  07/04/17 138 lb (62.6 kg)  02/19/17 134 lb 7.7 oz (61 kg)     GEN:  Patient is in no acute distress HEENT: Normal NECK: No JVD; No carotid bruits LYMPHATICS: No lymphadenopathy CARDIAC: Hear sounds regular, 2/6 systolic murmur at the apex. RESPIRATORY:  Clear to auscultation without rales, wheezing or rhonchi  ABDOMEN: Soft, non-tender, non-distended MUSCULOSKELETAL:  No edema; No deformity  SKIN: Warm and dry NEUROLOGIC:  Alert and oriented x 3 PSYCHIATRIC:  Normal affect   Signed, Garwin Brothers, MD  08/01/2017 9:49 AM     [facility]

## 2018-02-20 ENCOUNTER — Encounter: Payer: Self-pay | Admitting: Endocrinology

## 2018-02-20 ENCOUNTER — Ambulatory Visit (INDEPENDENT_AMBULATORY_CARE_PROVIDER_SITE_OTHER): Payer: 59 | Admitting: Endocrinology

## 2018-02-20 VITALS — BP 126/80 | HR 86 | Ht 61.5 in | Wt 136.0 lb

## 2018-02-20 DIAGNOSIS — E063 Autoimmune thyroiditis: Secondary | ICD-10-CM | POA: Diagnosis not present

## 2018-02-20 DIAGNOSIS — L80 Vitiligo: Secondary | ICD-10-CM

## 2018-02-20 DIAGNOSIS — Z23 Encounter for immunization: Secondary | ICD-10-CM | POA: Diagnosis not present

## 2018-02-20 DIAGNOSIS — Z131 Encounter for screening for diabetes mellitus: Secondary | ICD-10-CM | POA: Diagnosis not present

## 2018-02-20 LAB — T4, FREE: FREE T4: 1.3 ng/dL (ref 0.60–1.60)

## 2018-02-20 LAB — T3, FREE: T3 FREE: 3.5 pg/mL (ref 2.3–4.2)

## 2018-02-20 LAB — TSH: TSH: 1.09 u[IU]/mL (ref 0.35–4.50)

## 2018-02-20 LAB — HEMOGLOBIN A1C: HEMOGLOBIN A1C: 5.4 % (ref 4.6–6.5)

## 2018-02-20 NOTE — Progress Notes (Signed)
Patient ID: George Lopez, male   DOB: 1985/09/28, 32 y.o.   MRN: [id number]            Referring Provider: Dr. Caryn Bee Via  Reason for Appointment:  Hypothyroidism, new visit    History of Present Illness:   Hypothyroidism was first diagnosed in 2013  At the time of diagnosis patient was having a routine medical exam through his work and TSH were tested He was not complaining of any symptoms of  fatigue, cold sensitivity, difficulty concentrating, dry skin, weight gain at that time.           The patient has been treated with progressively higher doses of levothyroxine    With starting thyroid supplementation he did not feel any different Initially for the first couple of years he was taking his levothyroxine irregularly and was only taking 25 mcg start with He thinks that his TSH in the first couple of years was probably around 5-6 but no records are available  He says that in 2017 his TSH was staying relatively higher and his dose had been progressively increased up to 88 mcg However right after that his TSH was low for a short time In April 2018 apparently his dose was increased up to 125 mcg because his TSH reportedly went up to 26 At that time he was having some symptoms of fatigability but no weight change, cold intolerance or other symptoms Subsequently has been feeling better  Recently has had no fatigue, weight change or somnolence He has had TSH levels twice this year including the last one in June which have been normal  The patient takes the thyroid supplement before breakfast         Patient's weight history is as follows:  Wt Readings from Last 3 Encounters:  02/20/18 136 lb (61.7 kg)  08/01/17 135 lb 1.3 oz (61.3 kg)  07/04/17 138 lb (62.6 kg)    Thyroid function results have been as follows:  TSH on 11/03/2017 was 1.4  Lab Results  Component Value Date   TSH 1.709 06/26/2017     Past Medical History:  Diagnosis Date  . Hypothyroid   . PVC  (premature ventricular contraction)     History reviewed. No pertinent surgical history.  Family History  Problem Relation Age of Onset  . Hyperthyroidism Mother   . Hypertension Mother   . Diabetes Father     Social History:  reports that he has never smoked. He has never used smokeless tobacco. He reports that he drinks alcohol. His drug history is not on file.  Allergies: No Known Allergies  Allergies as of 02/20/2018   No Known Allergies     Medication List        Accurate as of 02/20/18 11:10 AM. Always use your most recent med list.          levothyroxine 125 MCG tablet Commonly known as:  SYNTHROID, LEVOTHROID Take 125 mcg by mouth daily.          Review of Systems  Constitutional: Negative for weight gain.  HENT: Negative for trouble swallowing.   Respiratory: Negative for shortness of breath.   Cardiovascular: Negative for leg swelling.       Only occasional palpitations, has had a long history of PVCs, more recently better with cutting back on caffeine and stress  Gastrointestinal: Negative for constipation.  Endocrine: Negative for fatigue.  Musculoskeletal: Negative for joint pain.  Skin:       He has had vitiligo  on his lips and few small spots on his arm and lower leg, treated with tacrolimus cream from a dermatologist and also a herbal oil with improvement  Neurological: Negative for numbness and tingling.  Psychiatric/Behavioral: Negative for insomnia.                Examination:    BP 126/80   Pulse 86   Ht 5' 1.5" (1.562 m)   Wt 136 lb (61.7 kg)   SpO2 98%   BMI 25.28 kg/m   GENERAL:  Average build.  Well-nourished, pleasant and alert  No pallor.    Skin:  no rash He has mild vitiligo of his upper lip and 1-2 scattered light-colored spots on the right forearm and near ankles  EYES:  No prominence of the eyes or swelling of the eyelids  ENT: Oral mucosa and tongue normal.  NECK: No lymphadenopathy  THYROID: Just palpable on  the right, smooth, minimally enlarged and soft.  No nodules  HEART:  Normal  S1 and S2; no murmur or click.  CHEST:    Lungs: Vescicular breath sounds heard equally.  No crepitations/ wheeze.  ABDOMEN:  No distention.  Liver and spleen not palpable.  No other mass or tenderness.  NEUROLOGICAL: Reflexes are bilaterally slightly brisk at biceps, normal at ankles.  EXTREMITIES:  Normal peripheral joints.  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 to be from autoimmune causes  He has a 6-year history of hypothyroidism which appears to have been progressive especially in 2018 when he was symptomatic with the highest TSH reportedly 26 Previous records not available  Currently taking 125 mcg levothyroxine with no subjective fatigue or other typical hypothyroid symptoms He is very compliant with taking his levothyroxine on empty stomach every morning Most recent TSH levels have been normal including in 6/19  He is asking about causation of hypothyroidism, role of dietary changes and other autoimmune conditions associated  VITILIGO: This is mild and currently mostly prominent on his upper lip, treated by dermatologist with improvement  History of minimally symptomatic reportedly PVCs, benign  Family history of diabetes and random glucose of 159 in June  PLAN:   Check thyroid levels today Check A1c for screening  Follow-up in 6 months if thyroid levels are normal  Influenza vaccine given  [name] 02/20/2018, 11:10 AM   Consultation note copy sent to the PCP  Note: This office note was prepared with Dragon voice recognition system technology. Any transcriptional errors that result from this process are unintentional.   Addendum: TSH 1.1, he will continue the same dose and follow-up in 6 months A1c 5.4

## 2018-02-22 ENCOUNTER — Other Ambulatory Visit: Payer: Self-pay

## 2018-02-22 MED ORDER — LEVOTHYROXINE SODIUM 125 MCG PO TABS: ORAL_TABLET | ORAL | 2 refills | Status: DC

## 2018-08-20 ENCOUNTER — Other Ambulatory Visit: Payer: PRIVATE HEALTH INSURANCE

## 2018-08-23 ENCOUNTER — Ambulatory Visit: Payer: PRIVATE HEALTH INSURANCE | Admitting: Endocrinology

## 2018-10-09 ENCOUNTER — Other Ambulatory Visit (INDEPENDENT_AMBULATORY_CARE_PROVIDER_SITE_OTHER): Payer: PRIVATE HEALTH INSURANCE

## 2018-10-09 ENCOUNTER — Other Ambulatory Visit: Payer: Self-pay

## 2018-10-09 DIAGNOSIS — E063 Autoimmune thyroiditis: Secondary | ICD-10-CM | POA: Diagnosis not present

## 2018-10-09 LAB — T4, FREE: Free T4: 1.29 ng/dL (ref 0.60–1.60)

## 2018-10-09 LAB — TSH: TSH: 3.04 u[IU]/mL (ref 0.35–4.50)

## 2018-10-12 ENCOUNTER — Other Ambulatory Visit: Payer: Self-pay

## 2018-10-12 ENCOUNTER — Ambulatory Visit (INDEPENDENT_AMBULATORY_CARE_PROVIDER_SITE_OTHER): Payer: PRIVATE HEALTH INSURANCE | Admitting: Endocrinology

## 2018-10-12 ENCOUNTER — Encounter: Payer: Self-pay | Admitting: Endocrinology

## 2018-10-12 DIAGNOSIS — Z131 Encounter for screening for diabetes mellitus: Secondary | ICD-10-CM

## 2018-10-12 DIAGNOSIS — E063 Autoimmune thyroiditis: Secondary | ICD-10-CM

## 2018-10-12 DIAGNOSIS — E7800 Pure hypercholesterolemia, unspecified: Secondary | ICD-10-CM | POA: Diagnosis not present

## 2018-10-12 NOTE — Progress Notes (Signed)
Patient ID: George Lopez, male   DOB: 24-May-1985, 33 y.o.   MRN: [id number]             Today's office visit was provided via telemedicine using video technique Explained to the patient and the the limitations of evaluation and management by telemedicine and the availability of in person appointments.  The patient understood the limitations and agreed to proceed. Patient also understood that the telehealth visit is billable. . Location of the patient: Home . Location of the provider: Office Only the patient and myself were participating in the encounter    Reason for Appointment:  Hypothyroidism, new visit    History of Present Illness:   Hypothyroidism was first diagnosed in 2013  At the time of diagnosis patient was having a routine medical exam through his work and TSH were tested He was not complaining of any symptoms of  fatigue, cold sensitivity, difficulty concentrating, dry skin, weight gain at that time.           The patient has been treated with progressively higher doses of levothyroxine    With starting thyroid supplementation he did not feel any different Initially for the first couple of years he was taking his levothyroxine irregularly and was only taking 25 mcg start with He thinks that his TSH in the first couple of years was probably around 5-6 but no records are available  Previously and 2017 his TSH was staying relatively higher and his dose had been progressively increased up to 88 mcg However right after that his TSH was low for a short time In April 2018 apparently his dose was increased up to 125 mcg because his TSH reportedly went up to 26 At that time he was having some symptoms of fatigability but no weight change, cold intolerance or other symptoms  RECENT history: Has had no symptoms of fatigue, decreased concentration or lethargy Last visit was 6 months ago With his TSH being consistently normal his dose has been continued at 125 mcg daily   The patient takes the thyroid supplement before breakfast consistently TSH is within normal         Patient's weight history is as follows:  Wt Readings from Last 3 Encounters:  02/20/18 136 lb (61.7 kg)  08/01/17 135 lb 1.3 oz (61.3 kg)  07/04/17 138 lb (62.6 kg)    Thyroid function results have been as follows:  TSH on 11/03/2017 was 1.4  Lab Results  Component Value Date   TSH 3.04 10/09/2018   TSH 1.09 02/20/2018   TSH 1.709 06/26/2017   FREET4 1.29 10/09/2018   FREET4 1.30 02/20/2018   T3FREE 3.5 02/20/2018   Other problems are addressed: See review of systems  Past Medical History:  Diagnosis Date  . Hypothyroid   . PVC (premature ventricular contraction)     History reviewed. No pertinent surgical history.  Family History  Problem Relation Age of Onset  . Hypertension Mother   . Diabetes Mother   . Thyroid disease Father   . Diabetes Maternal Grandfather     Social History:  reports that he has never smoked. He has never used smokeless tobacco. He reports current alcohol use. No history on file for drug.  Allergies: No Known Allergies  Allergies as of 10/12/2018   No Known Allergies     Medication List       Accurate as of Oct 12, 2018  4:05 PM. If you have any questions, ask your nurse or doctor.  levothyroxine 125 MCG tablet Commonly known as:  SYNTHROID Take one tablet by mouth daily          Review of Systems   He is taking some Ayurvedic supplements for his vitiligo which appears to be working He is asking about whether these would affect his thyroid or his medication Most of these are herbal formulations and discussed that as long as they do not have thyroid extract would be fine  He is also asking about how to deal with stress related to working at home, pandemic related issues      Currently does not have a PCP  Previously screened for diabetes  Lab Results  Component Value Date   HGBA1C 5.4 02/20/2018   Lab Results   Component Value Date   LDLCALC 123 (H) 07/04/2017   CREATININE 0.92 06/26/2017           Examination:    There were no vitals taken for this visit.    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 to be from autoimmune causes  He has a  history of hypothyroidism with the highest TSH reportedly 26 Most recently was 125 mcg daily is subjectively doing well and his TSH is consistently normal Most likely this is his stable dose considering his weight  PLAN:  No change in thyroid supplement Check thyroid levels every 6 months  Annual diabetes screening Needs to establish PCP  Recommend nonpharmacological stress reduction with regular exercise, proper rest and sleep, meditation and yoga   [name] 10/12/2018, 4:05 PM     Note: This office note was prepared with Dragon voice recognition system technology. Any transcriptional errors that result from this process are unintentional.

## 2018-10-14 DIAGNOSIS — E7800 Pure hypercholesterolemia, unspecified: Secondary | ICD-10-CM | POA: Insufficient documentation

## 2018-11-26 IMAGING — CR DG CHEST 2V
2 series · 2 of 2 positions shown · non-contrast
Comparison: None.

CLINICAL DATA: Acute onset of palpitations.  Tachycardia.

EXAM:
CHEST  2 VIEW

[chest pa]
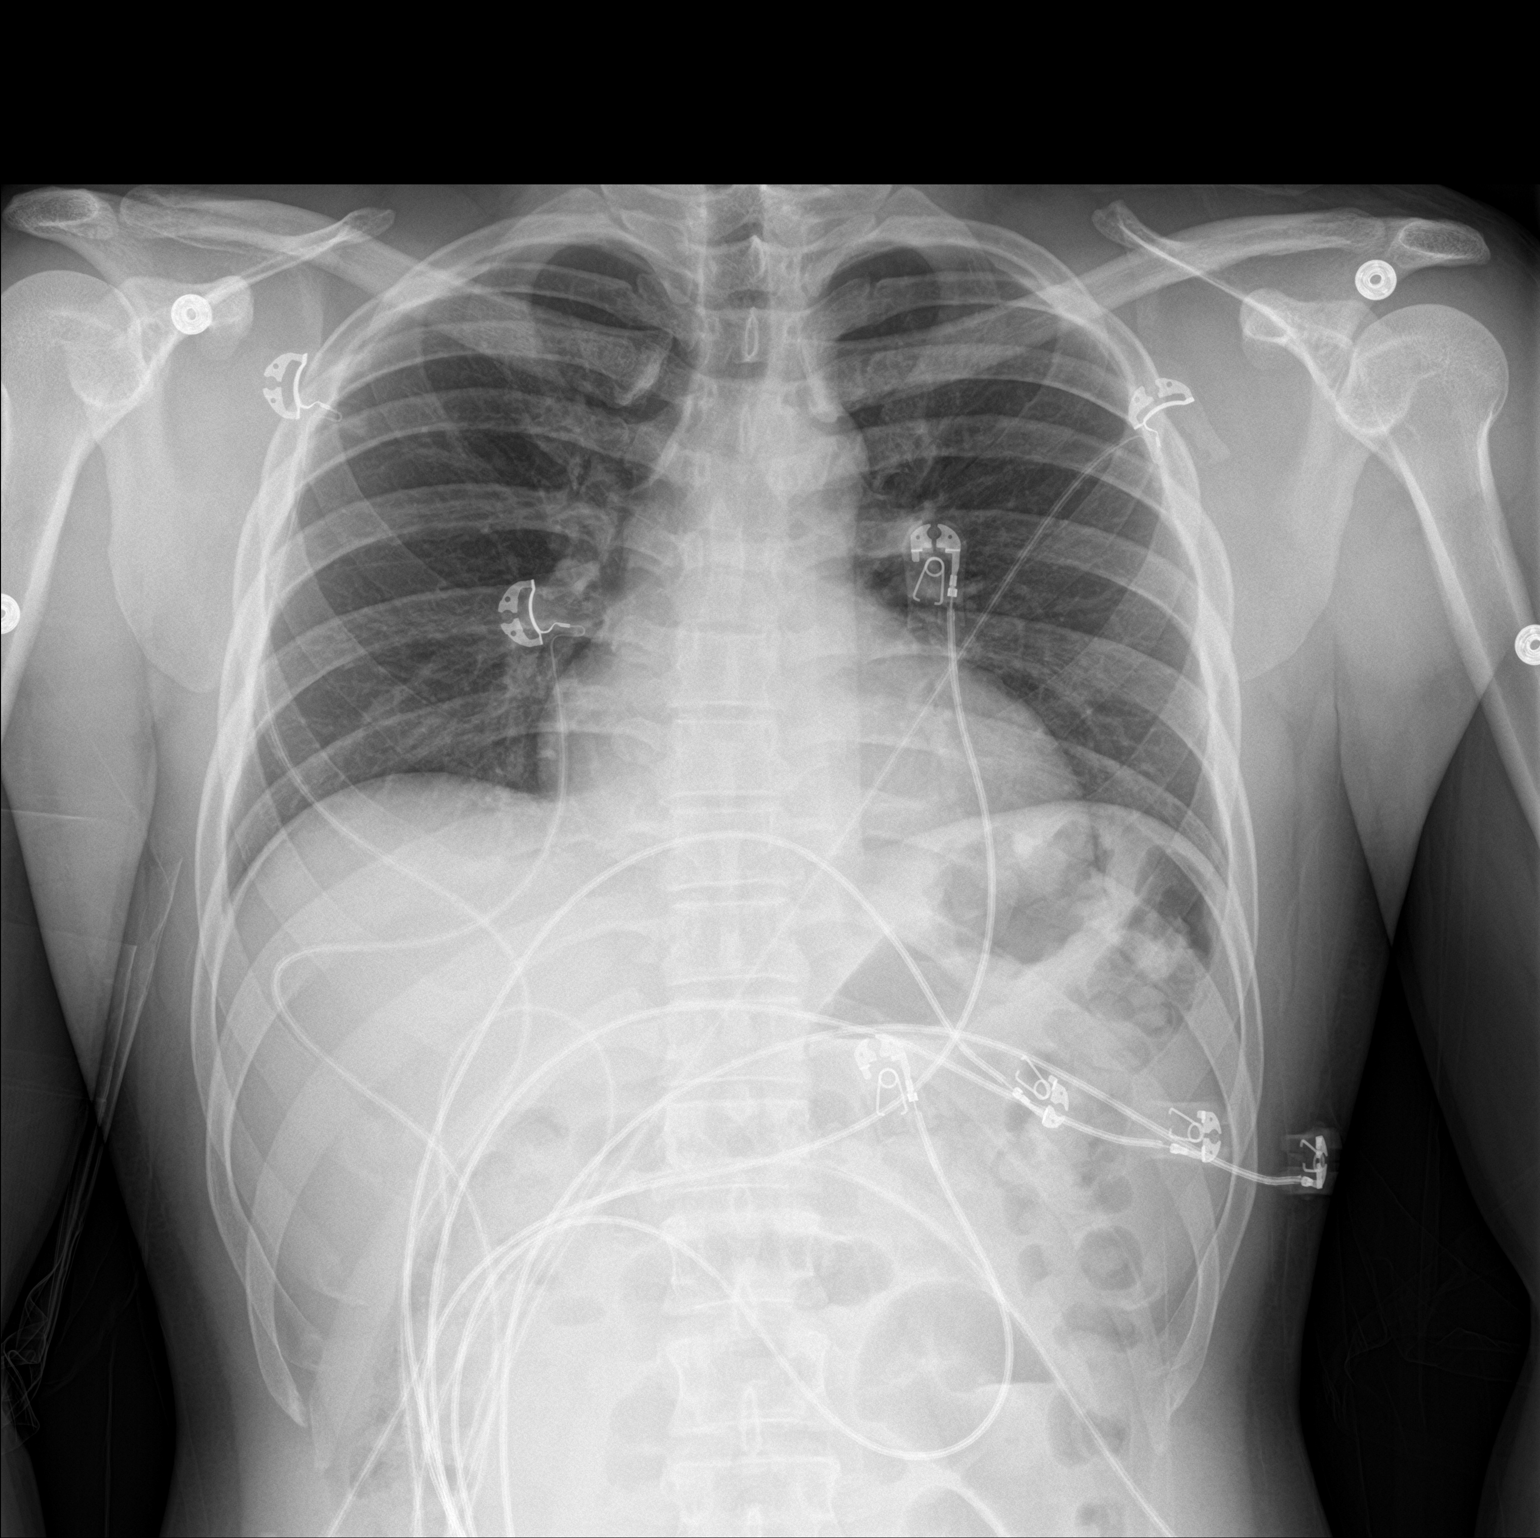

[chest lat]
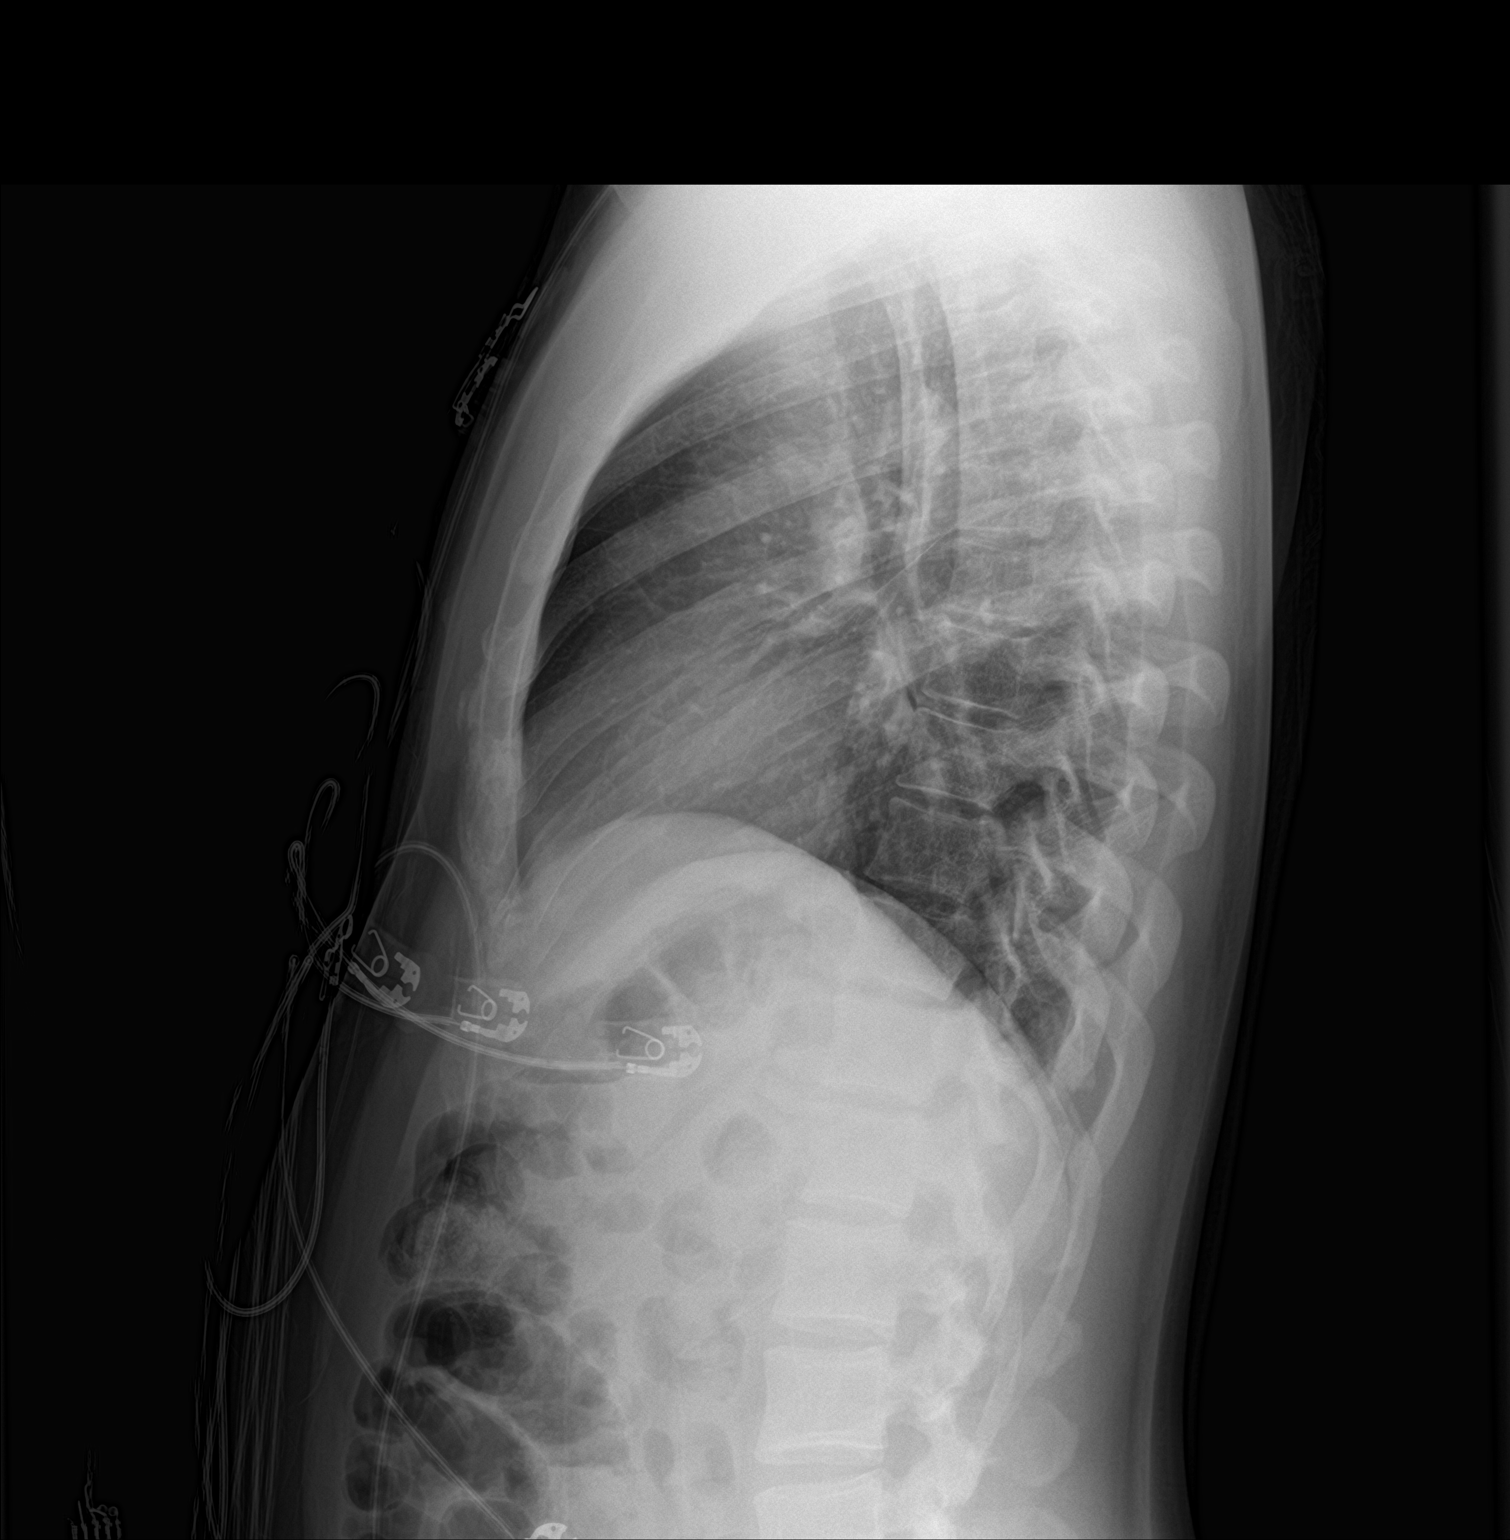

[2 of 2 positions shown; findings below may reference images not displayed]

FINDINGS: The lungs are well-aerated and clear. There is no evidence of focal
opacification, pleural effusion or pneumothorax.

The heart is normal in size; the mediastinal contour is within
normal limits. No acute osseous abnormalities are seen.
IMPRESSION: No acute cardiopulmonary process seen.

## 2019-01-10 ENCOUNTER — Other Ambulatory Visit: Payer: Self-pay | Admitting: Endocrinology

## 2019-02-18 ENCOUNTER — Other Ambulatory Visit: Payer: Self-pay

## 2019-02-18 DIAGNOSIS — E063 Autoimmune thyroiditis: Secondary | ICD-10-CM

## 2019-02-18 MED ORDER — LEVOTHYROXINE SODIUM 125 MCG PO TABS: ORAL_TABLET | ORAL | 0 refills | Status: DC

## 2019-03-14 ENCOUNTER — Other Ambulatory Visit: Payer: Self-pay | Admitting: Endocrinology

## 2019-03-14 DIAGNOSIS — E063 Autoimmune thyroiditis: Secondary | ICD-10-CM

## 2019-04-15 ENCOUNTER — Other Ambulatory Visit: Payer: PRIVATE HEALTH INSURANCE

## 2019-04-18 ENCOUNTER — Ambulatory Visit: Payer: PRIVATE HEALTH INSURANCE | Admitting: Endocrinology

## 2019-04-18 ENCOUNTER — Telehealth: Payer: Self-pay

## 2019-04-18 NOTE — Telephone Encounter (Signed)
Patient is rescheduled for labs and appt

## 2019-05-12 ENCOUNTER — Other Ambulatory Visit: Payer: Self-pay | Admitting: Endocrinology

## 2019-05-12 DIAGNOSIS — E063 Autoimmune thyroiditis: Secondary | ICD-10-CM

## 2019-05-13 ENCOUNTER — Telehealth: Payer: Self-pay

## 2019-05-13 NOTE — Telephone Encounter (Signed)
Please see message and advise. Pt's daughter took his Levothyroxine 125 mcg.

## 2019-05-13 NOTE — Telephone Encounter (Signed)
If she took 1 tablet most likely there should be no problem but needs to look out for any symptoms of palpitations or fast heartbeat.  Currently no antidote for that.  They should check with the pediatrician also

## 2019-05-13 NOTE — Telephone Encounter (Signed)
Patient called in saying he thinks his daughter (52yrs old) took his medication by mistake and wants to know what to do. I also told him to contact her Dr, also while waiting on Dr. Dwyane Dee response     Please advise.George KitchenMarland KitchenMarland Lopez

## 2019-05-13 NOTE — Telephone Encounter (Signed)
Spoke to pt told him per Dr. Dwyane Dee If she took 1 tablet most likely there should be no problem but needs to look out for any symptoms of palpitations or fast heartbeat.  Currently no antidote for that.  They should check with the pediatrician also. Pt verbalized understanding and said he did reach out to child's pediatrician and poison control and was told to monitor symptoms. He has the child wearing a pulse ox. Told him okay good.

## 2019-05-23 ENCOUNTER — Ambulatory Visit: Payer: PRIVATE HEALTH INSURANCE | Attending: Internal Medicine

## 2019-05-23 DIAGNOSIS — Z20822 Contact with and (suspected) exposure to covid-19: Secondary | ICD-10-CM

## 2019-05-25 ENCOUNTER — Telehealth: Payer: Self-pay | Admitting: Adult Health

## 2019-05-25 LAB — NOVEL CORONAVIRUS, NAA: SARS-CoV-2, NAA: DETECTED — AB

## 2019-05-25 NOTE — Telephone Encounter (Signed)
Called patient about Positive Covid test.  2 patient identifiers confirmed.  Date Tested: 05/23/2019  Date of Symptom onset: 05/21/2019   Symptoms: Mild  Isolation Recommendations:  Patient understands the needs to stay in isolation for a total of 10 days from onset of symptom or 14 days total from date of testing if no symptom. Reviewed Masking.    Supportive Care Recommendations: Encouraged plenty of fluid intake, Tylenol per package directions, and to remain as active as possible.    Patient knows the health department may be in touch.    I answered all of patient's questions and all concerns addressed. ER precautions reviewed.  Gave information on My chart.    Time Spent: 8 minutes  Lillard Anes, NP

## 2019-07-09 ENCOUNTER — Other Ambulatory Visit: Payer: Self-pay

## 2019-07-09 ENCOUNTER — Other Ambulatory Visit (INDEPENDENT_AMBULATORY_CARE_PROVIDER_SITE_OTHER): Payer: 59

## 2019-07-09 DIAGNOSIS — E7800 Pure hypercholesterolemia, unspecified: Secondary | ICD-10-CM

## 2019-07-09 DIAGNOSIS — E063 Autoimmune thyroiditis: Secondary | ICD-10-CM | POA: Diagnosis not present

## 2019-07-09 DIAGNOSIS — Z131 Encounter for screening for diabetes mellitus: Secondary | ICD-10-CM

## 2019-07-10 LAB — LIPID PANEL
Cholesterol: 190 mg/dL (ref 0–200)
HDL: 36.7 mg/dL — ABNORMAL LOW (ref >39.00)
LDL Cholesterol: 124 mg/dL — ABNORMAL HIGH (ref 0–99)
NonHDL: 153.25
Total CHOL/HDL Ratio: 5
Triglycerides: 145 mg/dL (ref 0.0–149.0)
VLDL: 29 mg/dL (ref 0.0–40.0)

## 2019-07-10 LAB — TSH: TSH: 2.22 u[IU]/mL (ref 0.35–4.50)

## 2019-07-10 LAB — GLUCOSE, RANDOM: Glucose, Bld: 140 mg/dL — ABNORMAL HIGH (ref 70–99)

## 2019-07-10 LAB — T4, FREE: Free T4: 1.19 ng/dL (ref 0.60–1.60)

## 2019-07-11 LAB — HEMOGLOBIN A1C: Hgb A1c MFr Bld: 5.3 % (ref 4.6–6.5)

## 2019-07-16 ENCOUNTER — Ambulatory Visit (INDEPENDENT_AMBULATORY_CARE_PROVIDER_SITE_OTHER): Payer: 59 | Admitting: Endocrinology

## 2019-07-16 ENCOUNTER — Other Ambulatory Visit: Payer: Self-pay

## 2019-07-16 ENCOUNTER — Encounter: Payer: Self-pay | Admitting: Endocrinology

## 2019-07-16 VITALS — BP 118/80 | HR 78 | Ht 61.5 in | Wt 137.6 lb

## 2019-07-16 DIAGNOSIS — E063 Autoimmune thyroiditis: Secondary | ICD-10-CM

## 2019-07-16 DIAGNOSIS — Z131 Encounter for screening for diabetes mellitus: Secondary | ICD-10-CM | POA: Diagnosis not present

## 2019-07-16 DIAGNOSIS — E785 Hyperlipidemia, unspecified: Secondary | ICD-10-CM

## 2019-07-16 NOTE — Progress Notes (Signed)
Patient ID: George Lopez, male   DOB: 06-23-1985, 34 y.o.   MRN: [id number]              Reason for Appointment:  Hypothyroidism, follow-up visit    History of Present Illness:   Hypothyroidism was first diagnosed in 2013  At the time of diagnosis patient was having a routine medical exam through his work and TSH were tested He was not complaining of any symptoms of  fatigue, cold sensitivity, difficulty concentrating, dry skin, weight gain at that time.           The patient has been treated with progressively higher doses of levothyroxine    With starting thyroid supplementation he did not feel any different Initially for the first couple of years he was taking his levothyroxine irregularly and was only taking 25 mcg start with He thinks that his TSH in the first couple of years was probably around 5-6 but no records are available  Previously and 2017 his TSH was staying relatively higher and his dose had been progressively increased up to 88 mcg However right after that his TSH was low for a short time In April 2018 apparently his dose was increased up to 125 mcg because his TSH reportedly went up to 26 At that time he was having some symptoms of fatigability but no weight change, cold intolerance or other symptoms  RECENT history: He has taken his levothyroxine very regularly on waking up Has not had any need to change his medications for about 3 years now As before he usually does not have symptoms when his thyroid levels are low except possibly some fatigue Recently feels good and his weight is stable  TSH again is quite normal         Patient's weight history is as follows:  Wt Readings from Last 3 Encounters:  07/16/19 137 lb 9.6 oz (62.4 kg)  02/20/18 136 lb (61.7 kg)  08/01/17 135 lb 1.3 oz (61.3 kg)    Thyroid function results have been as follows:  TSH on 11/03/2017 was 1.4  Lab Results  Component Value Date   TSH 2.22 07/09/2019   TSH 3.04 10/09/2018   TSH 1.09 02/20/2018   TSH 1.709 06/26/2017   FREET4 1.19 07/09/2019   FREET4 1.29 10/09/2018   FREET4 1.30 02/20/2018   T3FREE 3.5 02/20/2018   Other problems are addressed: See review of systems  Past Medical History:  Diagnosis Date  . Hypothyroid   . PVC (premature ventricular contraction)     History reviewed. No pertinent surgical history.  Family History  Problem Relation Age of Onset  . Hypertension Mother   . Diabetes Mother   . Thyroid disease Father   . Diabetes Maternal Grandfather     Social History:  reports that he has never smoked. He has never used smokeless tobacco. He reports current alcohol use. No history on file for drug.  Allergies: No Known Allergies  Allergies as of 07/16/2019   No Known Allergies     Medication List       Accurate as of July 16, 2019  4:14 PM. If you have any questions, ask your nurse or doctor.        levothyroxine 125 MCG tablet Commonly known as: SYNTHROID TAKE 1 TABLET BY MOUTH EVERY DAY          Review of Systems He is taking Ayurvedic supplements for his vitiligo with improvement  Hyperlipidemia: He has high LDL and low HDL He  says he is not exercising Diet is usually lower in saturated fat, using coconut products only occasionally He thinks his lipids have been about the same for some time  Lab Results  Component Value Date   CHOL 190 07/09/2019   HDL 36.70 (L) 07/09/2019   LDLCALC 124 (H) 07/09/2019   TRIG 145.0 07/09/2019   CHOLHDL 5 07/09/2019     Still does not have a PCP  Diabetes screening: Recently the blood sugar was 140 right after his lunch meal, A1c consistently normal  Lab Results  Component Value Date   HGBA1C 5.3 07/09/2019   HGBA1C 5.4 02/20/2018   Lab Results  Component Value Date   LDLCALC 124 (H) 07/09/2019   CREATININE 0.92 06/26/2017           Examination:    BP 118/80 (BP Location: Left Arm, Patient Position: Sitting, Cuff Size: Normal)   Pulse 78   Ht 5' 1.5"  (1.562 m)   Wt 137 lb 9.6 oz (62.4 kg)   SpO2 98%   BMI 25.58 kg/m   Thyroid is enlarged about 1-1/2 times normal on the right, firm and smooth Left lobe not palpable  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 to be from autoimmune causes  He has a  history of hypothyroidism with the highest TSH reportedly 26 He has had stable controlled with 125 mg levothyroxine As before he is usually not having symptoms if at all when he has hypothyroidism  Very small right thyroid enlargement, stable since his initial exam  Dyslipidemia: Currently does not have any high risk to start a statin drug even though his LDL is over 100 and considering his age HDL is low  PLAN:  Continue levothyroxine 125 mcg daily Follow-up in 1 year unless having unusual fatigue  Encouraged him to start exercise regimen for multiple benefits especially with his family history of diabetes and also dyslipidemia   [name] 07/16/2019, 4:14 PM     Note: This office note was prepared with Dragon voice recognition system technology. Any transcriptional errors that result from this process are unintentional.

## 2019-10-08 ENCOUNTER — Other Ambulatory Visit: Payer: Self-pay | Admitting: Endocrinology

## 2019-10-08 DIAGNOSIS — E063 Autoimmune thyroiditis: Secondary | ICD-10-CM

## 2020-01-05 ENCOUNTER — Other Ambulatory Visit: Payer: Self-pay | Admitting: Endocrinology

## 2020-01-05 DIAGNOSIS — E063 Autoimmune thyroiditis: Secondary | ICD-10-CM

## 2020-07-09 ENCOUNTER — Other Ambulatory Visit: Payer: Self-pay

## 2020-07-09 ENCOUNTER — Other Ambulatory Visit (INDEPENDENT_AMBULATORY_CARE_PROVIDER_SITE_OTHER): Payer: Self-pay

## 2020-07-09 DIAGNOSIS — Z131 Encounter for screening for diabetes mellitus: Secondary | ICD-10-CM

## 2020-07-09 DIAGNOSIS — E063 Autoimmune thyroiditis: Secondary | ICD-10-CM

## 2020-07-09 DIAGNOSIS — E785 Hyperlipidemia, unspecified: Secondary | ICD-10-CM

## 2020-07-09 LAB — T4, FREE: Free T4: 1.45 ng/dL (ref 0.60–1.60)

## 2020-07-09 LAB — GLUCOSE, RANDOM: Glucose, Bld: 90 mg/dL (ref 70–99)

## 2020-07-09 LAB — LIPID PANEL
Cholesterol: 189 mg/dL (ref 0–200)
HDL: 46.5 mg/dL (ref >39.00)
LDL Cholesterol: 116 mg/dL — ABNORMAL HIGH (ref 0–99)
NonHDL: 142.49
Total CHOL/HDL Ratio: 4
Triglycerides: 130 mg/dL (ref 0.0–149.0)
VLDL: 26 mg/dL (ref 0.0–40.0)

## 2020-07-09 LAB — TSH: TSH: 2.48 u[IU]/mL (ref 0.35–4.50)

## 2020-07-14 ENCOUNTER — Ambulatory Visit: Payer: 59 | Admitting: Endocrinology

## 2020-07-14 NOTE — Progress Notes (Incomplete)
Patient ID: George Lopez, male   DOB: 07-08-1985, 36 y.o.   MRN: [id number]              Reason for Appointment:  Hypothyroidism, follow-up visit    History of Present Illness:   Hypothyroidism was first diagnosed in 2013  At the time of diagnosis patient was having a routine medical exam through his work and TSH were tested He was not complaining of any symptoms of  fatigue, cold sensitivity, difficulty concentrating, dry skin, weight gain at that time.           The patient has been treated with progressively higher doses of levothyroxine    With starting thyroid supplementation he did not feel any different Initially for the first couple of years he was taking his levothyroxine irregularly and was only taking 25 mcg start with He thinks that his TSH in the first couple of years was probably around 5-6 but no records are available  Previously and 2017 his TSH was staying relatively higher and his dose had been progressively increased up to 88 mcg However right after that his TSH was low for a short time In April 2018 apparently his dose was increased up to 125 mcg because his TSH reportedly went up to 26 At that time he was having some symptoms of fatigability but no weight change, cold intolerance or other symptoms  RECENT history: He has taken his levothyroxine very regularly on waking up Has not had any need to change his medications for about 3 years now As before he usually does not have symptoms when his thyroid levels are low except possibly some fatigue Recently feels good and his weight is stable  TSH again is quite normal         Patient's weight history is as follows:  Wt Readings from Last 3 Encounters:  07/16/19 137 lb 9.6 oz (62.4 kg)  02/20/18 136 lb (61.7 kg)  08/01/17 135 lb 1.3 oz (61.3 kg)    Thyroid function results have been as follows:  TSH on 11/03/2017 was 1.4  Lab Results  Component Value Date   TSH 2.48 07/09/2020   TSH 2.22 07/09/2019    TSH 3.04 10/09/2018   TSH 1.09 02/20/2018   FREET4 1.45 07/09/2020   FREET4 1.19 07/09/2019   FREET4 1.29 10/09/2018   FREET4 1.30 02/20/2018   T3FREE 3.5 02/20/2018   Other problems are addressed: See review of systems  Past Medical History:  Diagnosis Date  . Hyperlipidemia   . Hypothyroid   . PVC (premature ventricular contraction)     No past surgical history on file.  Family History  Problem Relation Age of Onset  . Hypertension Mother   . Diabetes Mother   . Thyroid disease Father   . Diabetes Maternal Grandfather     Social History:  reports that he has never smoked. He has never used smokeless tobacco. He reports current alcohol use. No history on file for drug use.  Allergies: No Known Allergies  Allergies as of 07/14/2020   No Known Allergies     Medication List       Accurate as of July 14, 2020  4:12 PM. If you have any questions, ask your nurse or doctor.        levothyroxine 125 MCG tablet Commonly known as: SYNTHROID TAKE 1 TABLET BY MOUTH EVERY DAY          Review of Systems He is taking Ayurvedic supplements for his vitiligo with  improvement  Hyperlipidemia: He has high LDL and low HDL He says he is not exercising Diet is usually lower in saturated fat, using coconut products only occasionally He thinks his lipids have been about the same for some time  Lab Results  Component Value Date   CHOL 189 07/09/2020   HDL 46.50 07/09/2020   LDLCALC 116 (H) 07/09/2020   TRIG 130.0 07/09/2020   CHOLHDL 4 07/09/2020     Still does not have a PCP  Diabetes screening: Recently the blood sugar was 140 right after his lunch meal, A1c consistently normal  Lab Results  Component Value Date   HGBA1C 5.3 07/09/2019   HGBA1C 5.4 02/20/2018   Lab Results  Component Value Date   LDLCALC 116 (H) 07/09/2020   CREATININE 0.92 06/26/2017           Examination:    There were no vitals taken for this visit.  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 to be from autoimmune causes  He has a  history of hypothyroidism with the highest TSH reportedly 26 He has had stable controlled with 125 mg levothyroxine As before he is usually not having symptoms if at all when he has hypothyroidism  Very small right thyroid enlargement, stable since his initial exam  Dyslipidemia: Currently does not have any high risk to start a statin drug even though his LDL is over 100 and considering his age HDL is low  PLAN:  Continue levothyroxine 125 mcg daily Follow-up in 1 year unless having unusual fatigue  Encouraged him to start exercise regimen for multiple benefits especially with his family history of diabetes and also dyslipidemia   [name] 07/14/2020, 4:12 PM     Note: This office note was prepared with Dragon voice recognition system technology. Any transcriptional errors that result from this process are unintentional.

## 2020-07-16 ENCOUNTER — Other Ambulatory Visit: Payer: Self-pay

## 2020-07-16 ENCOUNTER — Ambulatory Visit (INDEPENDENT_AMBULATORY_CARE_PROVIDER_SITE_OTHER): Payer: Self-pay | Admitting: Endocrinology

## 2020-07-16 ENCOUNTER — Encounter: Payer: Self-pay | Admitting: Endocrinology

## 2020-07-16 VITALS — BP 126/80 | HR 88 | Ht 61.0 in | Wt 140.0 lb

## 2020-07-16 DIAGNOSIS — E785 Hyperlipidemia, unspecified: Secondary | ICD-10-CM

## 2020-07-16 DIAGNOSIS — E063 Autoimmune thyroiditis: Secondary | ICD-10-CM

## 2020-07-16 NOTE — Progress Notes (Signed)
Patient ID: George Lopez, male   DOB: 1985-09-16, 35 y.o.   MRN: [id number]              Reason for Appointment:  Hypothyroidism, follow-up visit    History of Present Illness:   Hypothyroidism was first diagnosed in 2013  At the time of diagnosis patient was having a routine medical exam through his work and TSH were tested He was not complaining of any symptoms of  fatigue, cold sensitivity, difficulty concentrating, dry skin, weight gain at that time.           The patient has been treated with progressively higher doses of levothyroxine    With starting thyroid supplementation he did not feel any different Initially for the first couple of years he was taking his levothyroxine irregularly and was only taking 25 mcg start with He thinks that his TSH in the first couple of years was probably around 5-6 but no records are available  Previously and 2017 his TSH was staying relatively higher and his dose had been progressively increased up to 88 mcg However right after that his TSH was low for a short time In April 2018 apparently his dose was increased up to 125 mcg because his TSH reportedly went up to 26 At that time he was having some symptoms of fatigability but no weight change, cold intolerance or other symptoms  RECENT history: He takes his levothyroxine every morning consistently and usually not eating for about an hour after that  Has not had any need to change his levothyroxine dosage for about 4 years now  Previously has had no symptoms when his thyroid levels are low except possibly some fatigue  Currently not complaining of any fatigue except related to inadequate sleep at times  TSH again is in the normal range at 2.5         Patient's weight history is as follows:  Wt Readings from Last 3 Encounters:  07/16/20 140 lb (63.5 kg)  07/16/19 137 lb 9.6 oz (62.4 kg)  02/20/18 136 lb (61.7 kg)    Thyroid function results have been as follows:   Lab Results   Component Value Date   TSH 2.48 07/09/2020   TSH 2.22 07/09/2019   TSH 3.04 10/09/2018   TSH 1.09 02/20/2018   FREET4 1.45 07/09/2020   FREET4 1.19 07/09/2019   FREET4 1.29 10/09/2018   FREET4 1.30 02/20/2018   T3FREE 3.5 02/20/2018   Other problems are addressed: See review of systems  Past Medical History:  Diagnosis Date  . Hyperlipidemia   . Hypothyroid   . PVC (premature ventricular contraction)     No past surgical history on file.  Family History  Problem Relation Age of Onset  . Hypertension Mother   . Diabetes Mother   . Thyroid disease Father   . Diabetes Maternal Grandfather     Social History:  reports that he has never smoked. He has never used smokeless tobacco. He reports current alcohol use. No history on file for drug use.  Allergies: No Known Allergies  Allergies as of 07/16/2020   No Known Allergies     Medication List       Accurate as of July 16, 2020  1:30 PM. If you have any questions, ask your nurse or doctor.        levothyroxine 125 MCG tablet Commonly known as: SYNTHROID TAKE 1 TABLET BY MOUTH EVERY DAY          Review of  Systems  He thinks his vitiligo is better with cutting back on gluten and dairy products  Hyperlipidemia: He has high LDL and low HDL Since his last visit he has had periodically more exercise  Diet is usually lower in saturated fat, also recently has  reduced dairy products also He thinks his lipids have been about the same for several years  Currently his LDL is lower than before and HDL is improved  Lab Results  Component Value Date   CHOL 189 07/09/2020   CHOL 190 07/09/2019   CHOL 204 (H) 07/04/2017   Lab Results  Component Value Date   HDL 46.50 07/09/2020   HDL 36.70 (L) 07/09/2019   HDL 45 07/04/2017   Lab Results  Component Value Date   LDLCALC 116 (H) 07/09/2020   LDLCALC 124 (H) 07/09/2019   LDLCALC 123 (H) 07/04/2017   Lab Results  Component Value Date   TRIG 130.0 07/09/2020    TRIG 145.0 07/09/2019   TRIG 178 (H) 07/04/2017   Lab Results  Component Value Date   CHOLHDL 4 07/09/2020   CHOLHDL 5 07/09/2019   CHOLHDL 4.5 07/04/2017   No results found for: LDLDIRECT    Diabetes screening: Previously 140 right after his lunch meal, nonfasting glucose 90 A1c has been normal  Lab Results  Component Value Date   HGBA1C 5.3 07/09/2019   HGBA1C 5.4 02/20/2018   Lab Results  Component Value Date   LDLCALC 116 (H) 07/09/2020   CREATININE 0.92 06/26/2017           Examination:    BP 126/80   Pulse 88   Ht 5\' 1"  (1.549 m)   Wt 140 lb (63.5 kg)   SpO2 96%   BMI 26.45 kg/m   Thyroid is barely palpable on the right Left lobe not palpable  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 to be from autoimmune causes  He has a  history of hypothyroidism with the highest TSH reportedly 26 He has had very stable control of hypothyroidism with 125 mg levothyroxine Baseline symptoms are only mild fatigue  Very small right thyroid enlargement present and may be smaller than before  Dyslipidemia: LDL and HDL are improved with some lifestyle changes and exercise Again does not have any high risk to start a statin drug; again ideal LDL would be below 100  HDL is improved with exercise  Fasting glucose normal  PLAN:  Continue levothyroxine 125 mcg daily Follow-up in 1 year unless having unusual fatigue     07/16/2020, 1:30 PM     Note: This office note was prepared with 09/15/2020. Any transcriptional errors that result from this process are unintentional.

## 2020-09-02 ENCOUNTER — Other Ambulatory Visit: Payer: Self-pay | Admitting: Endocrinology

## 2020-09-02 DIAGNOSIS — E063 Autoimmune thyroiditis: Secondary | ICD-10-CM

## 2021-03-11 ENCOUNTER — Telehealth: Payer: Self-pay | Admitting: Endocrinology

## 2021-03-11 DIAGNOSIS — E063 Autoimmune thyroiditis: Secondary | ICD-10-CM

## 2021-03-11 NOTE — Telephone Encounter (Signed)
Pt at my window requesting refills for medications or a couple of months. He's moving to [place] in 2weeks and doesn't have an Actor, yet.

## 2021-03-15 MED ORDER — LEVOTHYROXINE SODIUM 125 MCG PO TABS: 125.0000 ug | ORAL_TABLET | Freq: Every day | ORAL | 1 refills | Status: AC

## 2021-03-15 NOTE — Telephone Encounter (Addendum)
Correct message: Pt was seen 07/2020. Pt compliant and up to date with appts. 90 day supply sent to preferred pharmacy.  Error message: Pt has not been seen since 07/2019 Ok to send in 90 day supply refills?

## 2021-03-15 NOTE — Telephone Encounter (Signed)
Pt up to date with appt's. 90 day supply sent to preferred pharmacy.
